# Patient Record
Sex: Male | Born: 2000 | Race: White | Hispanic: No | Marital: Single | Smoking: Never smoker
Health system: Southern US, Community
[De-identification: ages and names within clinical notes are randomized; demographics above are authoritative.]

## PROBLEM LIST (undated history)

## (undated) DIAGNOSIS — J189 Pneumonia, unspecified organism: Secondary | ICD-10-CM

## (undated) DIAGNOSIS — F419 Anxiety disorder, unspecified: Secondary | ICD-10-CM

## (undated) DIAGNOSIS — N1330 Unspecified hydronephrosis: Secondary | ICD-10-CM

## (undated) HISTORY — PX: ADENOIDECTOMY: SUR15

## (undated) HISTORY — PX: TONSILLECTOMY: SUR1361

---
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
## 2022-07-21 IMAGING — CR DG SHOULDER 2+V*R*
3 series · 3 of 3 positions shown · non-contrast
Comparison: Radiographs 06/04/2020

CLINICAL DATA: Shoulder dislocation

EXAM:
RIGHT SHOULDER - 2+ VIEW

[shoulder grashey]
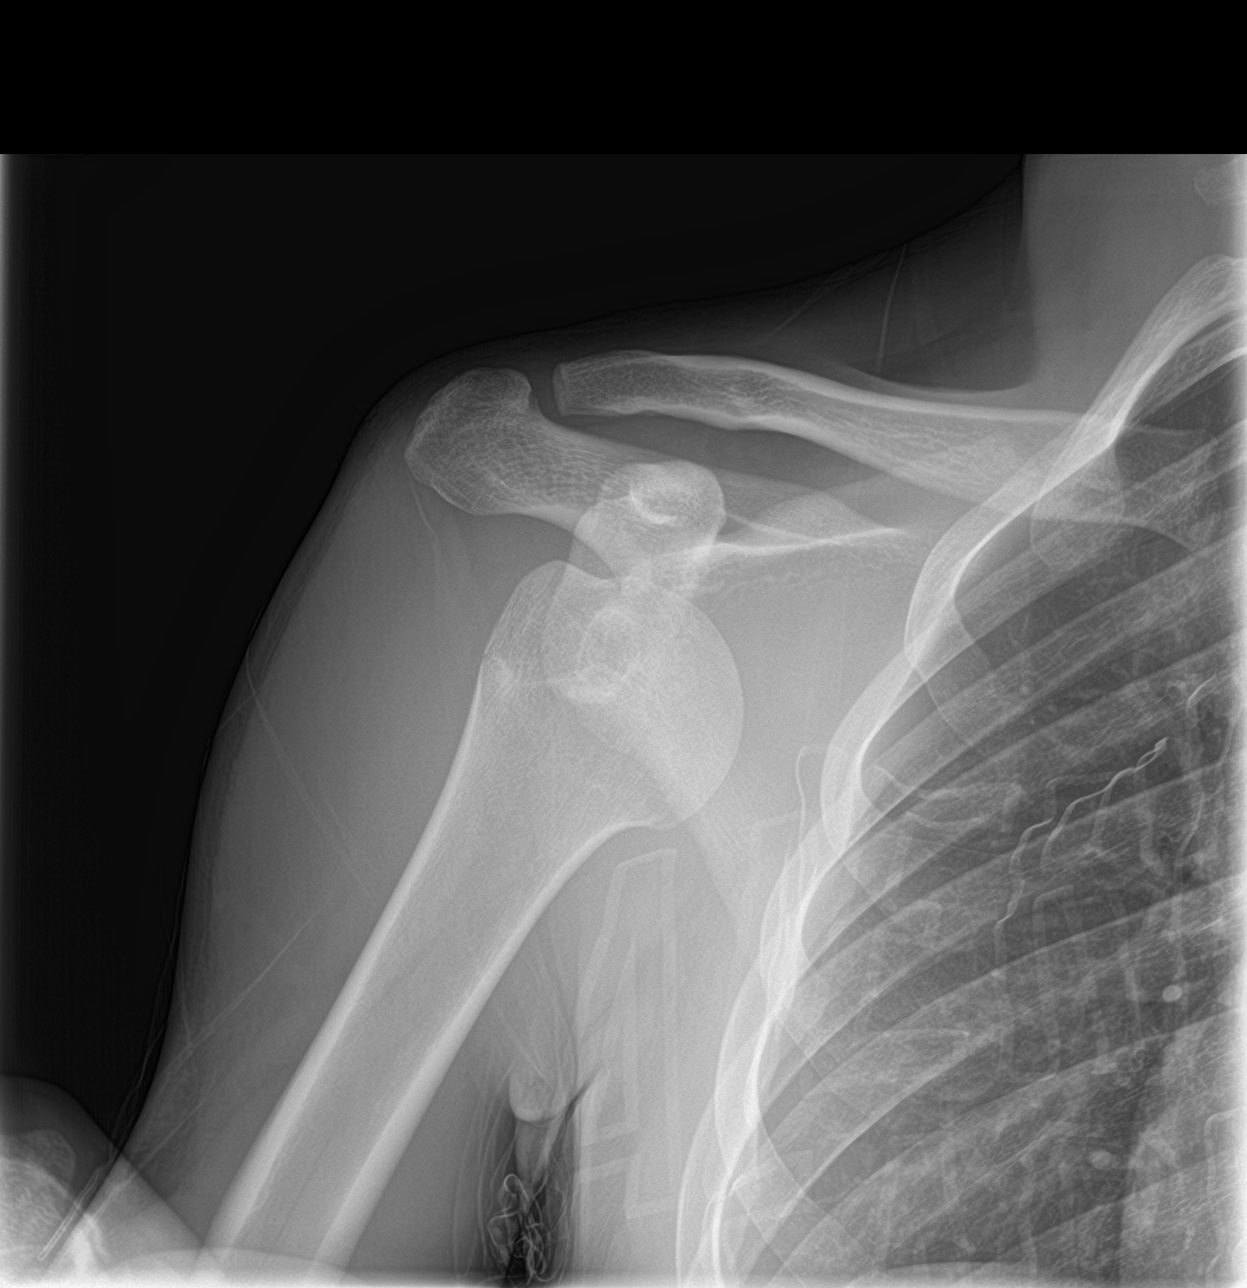

[shoulder y view]
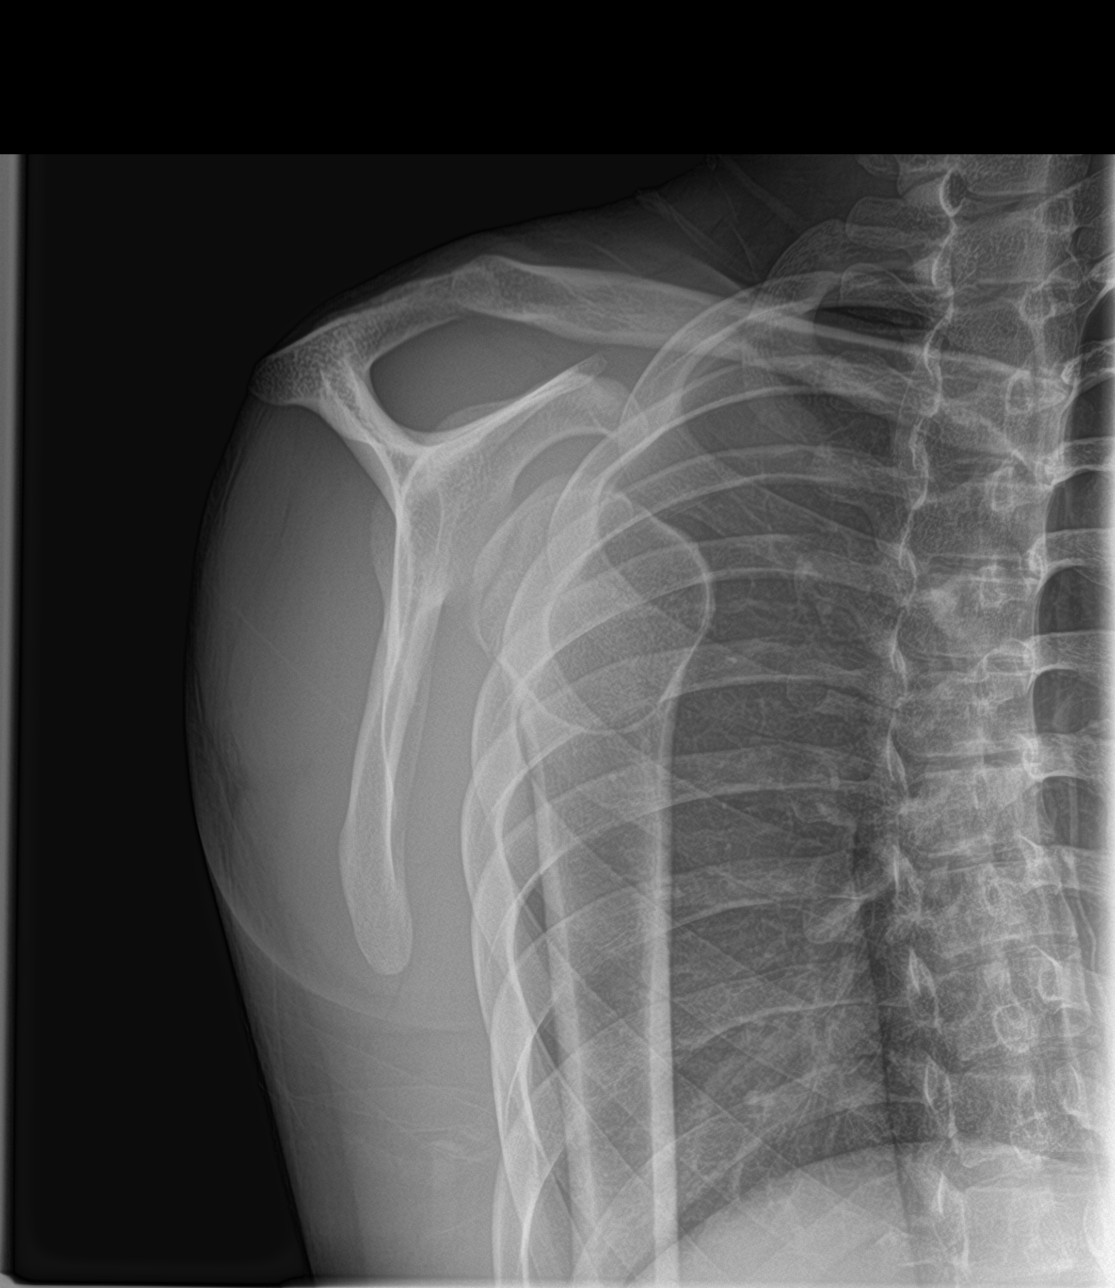

[shoulder ap neutral]
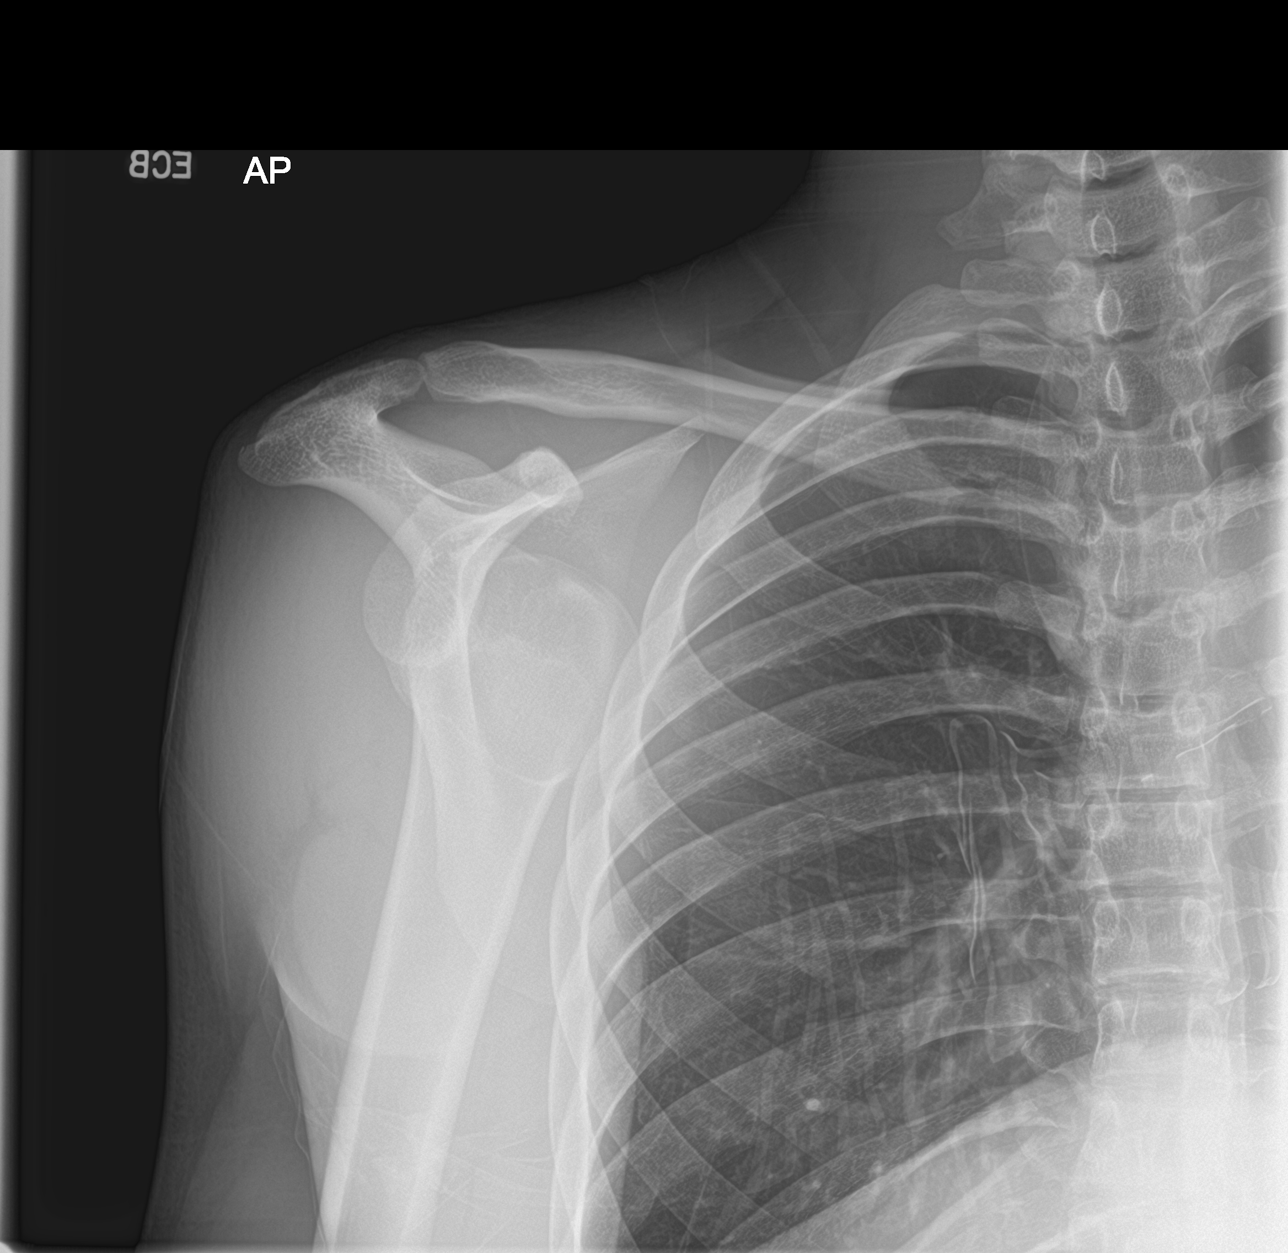

[3 of 3 positions shown; findings below may reference images not displayed]

FINDINGS: Recurrent anterior inferior shoulder dislocation. A posterolateral
humeral head impaction fracture (a so-called Hill-Sachs deformity)
is noted and appears perched upon the glenoid rim. No discernible
bony Bankart is seen. Soft tissue swelling of the shoulder is seen.
No other acute fracture or traumatic osseous injury. Included
portions of the right chest are unremarkable.
IMPRESSION: 1. Recurrent anterior shoulder dislocation.
2. Hill-Sachs deformity of the humeral head, perched upon the
glenoid rim. No discernible bony Bankart.
